# Patient Record
Sex: Female | Born: 2005 | Race: Black or African American | Hispanic: No | Marital: Single | Smoking: Never smoker
Health system: Southern US, Community
[De-identification: ages and names within clinical notes are randomized; demographics above are authoritative.]

## PROBLEM LIST (undated history)

## (undated) DIAGNOSIS — Z789 Other specified health status: Secondary | ICD-10-CM

## (undated) HISTORY — DX: Other specified health status: Z78.9

## (undated) HISTORY — PX: NO PAST SURGERIES: SHX2092

---
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
## 2024-02-24 ENCOUNTER — Other Ambulatory Visit: Payer: Self-pay

## 2024-02-24 ENCOUNTER — Emergency Department
Admission: EM | Admit: 2024-02-24 | Discharge: 2024-02-24 | Disposition: A | Discharge status: Home / Self Care | Attending: Emergency Medicine | Admitting: Emergency Medicine

## 2024-02-24 ENCOUNTER — Emergency Department

## 2024-02-24 DIAGNOSIS — O219 Vomiting of pregnancy, unspecified: Secondary | ICD-10-CM | POA: Diagnosis present

## 2024-02-24 DIAGNOSIS — Z3A01 Less than 8 weeks gestation of pregnancy: Secondary | ICD-10-CM | POA: Insufficient documentation

## 2024-02-24 DIAGNOSIS — R109 Unspecified abdominal pain: Secondary | ICD-10-CM | POA: Diagnosis not present

## 2024-02-24 DIAGNOSIS — O26891 Other specified pregnancy related conditions, first trimester: Secondary | ICD-10-CM | POA: Insufficient documentation

## 2024-02-24 LAB — COMPREHENSIVE METABOLIC PANEL WITH GFR
ALT: 20 U/L (ref 0–44)
AST: 29 U/L (ref 15–41)
Albumin: 4.7 g/dL (ref 3.5–5.0)
Alkaline Phosphatase: 52 U/L (ref 38–126)
Anion gap: 16 — ABNORMAL HIGH (ref 5–15)
BUN: 13 mg/dL (ref 6–20)
CO2: 18 mmol/L — ABNORMAL LOW (ref 22–32)
Calcium: 9.8 mg/dL (ref 8.9–10.3)
Chloride: 100 mmol/L (ref 98–111)
Creatinine, Ser: 0.7 mg/dL (ref 0.44–1.00)
GFR, Estimated: >60 mL/min (ref >60)
Glucose, Bld: 101 mg/dL — ABNORMAL HIGH (ref 70–99)
Potassium: 3.1 mmol/L — ABNORMAL LOW (ref 3.5–5.1)
Sodium: 134 mmol/L — ABNORMAL LOW (ref 135–145)
Total Bilirubin: 1.5 mg/dL — ABNORMAL HIGH (ref 0.0–1.2)
Total Protein: 8.2 g/dL — ABNORMAL HIGH (ref 6.5–8.1)

## 2024-02-24 LAB — URINALYSIS, ROUTINE W REFLEX MICROSCOPIC
Bilirubin Urine: NEGATIVE
Glucose, UA: NEGATIVE mg/dL
Hgb urine dipstick: NEGATIVE
Ketones, ur: 80 mg/dL — AB
Leukocytes,Ua: NEGATIVE
Nitrite: NEGATIVE
Protein, ur: 100 mg/dL — AB
Specific Gravity, Urine: 1.033 — ABNORMAL HIGH (ref 1.005–1.030)
pH: 5 (ref 5.0–8.0)

## 2024-02-24 LAB — CBC
HCT: 37.8 % (ref 36.0–46.0)
Hemoglobin: 12.5 g/dL (ref 12.0–15.0)
MCH: 28.5 pg (ref 26.0–34.0)
MCHC: 33.1 g/dL (ref 30.0–36.0)
MCV: 86.1 fL (ref 80.0–100.0)
Platelets: 357 K/uL (ref 150–400)
RBC: 4.39 MIL/uL (ref 3.87–5.11)
RDW: 13.4 % (ref 11.5–15.5)
WBC: 7.7 K/uL (ref 4.0–10.5)
nRBC: 0 % (ref 0.0–0.2)

## 2024-02-24 LAB — POC URINE PREG, ED: Preg Test, Ur: POSITIVE — AB

## 2024-02-24 LAB — LIPASE, BLOOD: Lipase: 36 U/L (ref 11–51)

## 2024-02-24 LAB — HCG, QUANTITATIVE, PREGNANCY: hCG, Beta Chain, Quant, S: 37910 m[IU]/mL — ABNORMAL HIGH (ref <5)

## 2024-02-24 MED ORDER — SODIUM CHLORIDE 0.9 % IV SOLN
12.5000 mg | Freq: Once | INTRAVENOUS | Status: AC
  Administered 2024-02-24: 12.5 mg via INTRAVENOUS
  Filled 2024-02-24: qty 12.5

## 2024-02-24 MED ORDER — DOXYLAMINE SUCCINATE (SLEEP) 25 MG PO TABS: 12.5000 mg | ORAL_TABLET | Freq: Three times a day (TID) | ORAL | 0 refills | Status: AC | PRN

## 2024-02-24 MED ORDER — POTASSIUM CHLORIDE 10 MEQ/100ML IV SOLN: 10.0000 meq | Freq: Once | INTRAVENOUS | Status: DC

## 2024-02-24 MED ORDER — VITAMIN B-6 50 MG PO TABS: 25.0000 mg | ORAL_TABLET | Freq: Three times a day (TID) | ORAL | 0 refills | Status: AC | PRN

## 2024-02-24 MED ORDER — DEXTROSE 5 % IN LACTATED RINGERS IV BOLUS
1000.0000 mL | Freq: Once | INTRAVENOUS | Status: AC
  Administered 2024-02-24: 1000 mL via INTRAVENOUS
  Filled 2024-02-24 (×2): qty 1000

## 2024-02-24 MED ORDER — ACETAMINOPHEN 325 MG PO TABS
650.0000 mg | ORAL_TABLET | Freq: Once | ORAL | Status: AC
  Administered 2024-02-24: 650 mg via ORAL
  Filled 2024-02-24: qty 2

## 2024-02-24 MED ORDER — PROMETHAZINE HCL 12.5 MG PO TABS: 12.5000 mg | ORAL_TABLET | Freq: Four times a day (QID) | ORAL | 0 refills | Status: AC | PRN

## 2024-02-24 MED ORDER — POTASSIUM CHLORIDE CRYS ER 20 MEQ PO TBCR
40.0000 meq | EXTENDED_RELEASE_TABLET | Freq: Once | ORAL | Status: AC
  Administered 2024-02-24: 40 meq via ORAL
  Filled 2024-02-24: qty 2

## 2024-02-24 NOTE — Discharge Instructions (Addendum)
 You were seen in the ER today for evaluation of your vomiting.  I suspect this is related to something called hyperemesis that can occur in pregnancy.  I sent a prescription for multiple nausea medicines to your pharmacy.  I would start by taking the pyridoxine and doxylamine .  However if you are still having symptoms with this you can take the promethazine  as needed.  I recommend frequent small meals.  Follow-up with OB/GYN for further evaluation.  Return to the ER if you develop new or worsening symptoms including worsening abdominal pain, vaginal bleeding, or inability to tolerate food or liquids despite taking the medication you were prescribed.

## 2024-02-24 NOTE — ED Provider Notes (Signed)
 [facility] Provider Note    Event Date/Time   First MD Initiated Contact with Patient 02/24/24 1048     (approximate)   History   Abdominal Pain   HPI  Dominique Salazar is a 18 year old female senting to the emergency department for evaluation of vomiting and abdominal cramping.  Patient reports that for the past month she has had some mild abdominal cramping with multiple episodes of nonbloody vomiting.  LMP 6/24, is sexually active.  No dysuria, urinary frequency.  Reports that her vomiting has been getting worse and she is no longer able to tolerate even liquids.  No vaginal bleeding.     Physical Exam   Triage Vital Signs: ED Triage Vitals  Encounter Vitals Group     BP 02/24/24 1038 (!) 139/90     Girls Systolic BP Percentile --      Girls Diastolic BP Percentile --      Boys Systolic BP Percentile --      Boys Diastolic BP Percentile --      Pulse Rate 02/24/24 1038 (!) 52     Resp 02/24/24 1038 18     Temp 02/24/24 1038 98 F (36.7 C)     Temp src --      SpO2 02/24/24 1038 99 %     Weight 02/24/24 1037 105 lb (47.6 kg)     Height 02/24/24 1037 5' 3 (1.6 m)     Head Circumference --      Peak Flow --      Pain Score 02/24/24 1037 8     Pain Loc --      Pain Education --      Exclude from Growth Chart --     Most recent vital signs: Vitals:   02/24/24 1038  BP: (!) 139/90  Pulse: (!) 52  Resp: 18  Temp: 98 F (36.7 C)  SpO2: 99%     General: Awake, interactive  CV:  Regular rate, good peripheral perfusion.  Resp:  Unlabored respirations.  Abd:  Nondistended, soft, no significant tenderness to palpation  Neuro:  Symmetric facial movement, fluid speech   ED Results / Procedures / Treatments   Labs (all labs ordered are listed, but only abnormal results are displayed) Labs Reviewed  COMPREHENSIVE METABOLIC PANEL WITH GFR - Abnormal; Notable for the following components:      Result Value   Sodium 134 (*)     Potassium 3.1 (*)    CO2 18 (*)    Glucose, Bld 101 (*)    Total Protein 8.2 (*)    Total Bilirubin 1.5 (*)    Anion gap 16 (*)    All other components within normal limits  URINALYSIS, ROUTINE W REFLEX MICROSCOPIC - Abnormal; Notable for the following components:   Color, Urine YELLOW (*)    APPearance HAZY (*)    Specific Gravity, Urine 1.033 (*)    Ketones, ur 80 (*)    Protein, ur 100 (*)    Bacteria, UA FEW (*)    All other components within normal limits  HCG, QUANTITATIVE, PREGNANCY - Abnormal; Notable for the following components:   hCG, Beta Chain, Quant, S 37,910 (*)    All other components within normal limits  POC URINE PREG, ED - Abnormal; Notable for the following components:   Preg Test, Ur Positive (*)    All other components within normal limits  LIPASE, BLOOD  CBC     EKG EKG independently reviewed  and interpreted by myself demonstrates:    RADIOLOGY Imaging independently reviewed and interpreted by myself demonstrates:  Ultrasound demonstrates IUP at estimated [redacted] weeks gestation  Formal Radiology Read:  US  OB LESS THAN 14 WEEKS WITH OB TRANSVAGINAL Result Date: 02/24/2024 CLINICAL DATA:  Abdominal pain in 1st trimester pregnancy. EXAM: OBSTETRIC <14 WK US  AND TRANSVAGINAL OB US  TECHNIQUE: Both transabdominal and transvaginal ultrasound examinations were performed for complete evaluation of the gestation as well as the maternal uterus, adnexal regions, and pelvic cul-de-sac. Transvaginal technique was performed to assess early pregnancy. COMPARISON:  None Available. FINDINGS: Intrauterine gestational sac: Single Yolk sac:  Visualized. Embryo:  Visualized. Cardiac Activity: Visualized. Heart Rate: 100 bpm CRL:  4 mm   6 w   1 d                  US  EDC: 10/18/2024 Subchorionic hemorrhage:  None visualized. Maternal uterus/adnexae: Small right ovarian corpus luteum noted. Normal appearance of left ovary. No mass or abnormal free fluid identified. IMPRESSION: Single  living IUP with estimated gestational age of [redacted] weeks 1 day. No maternal uterine or adnexal abnormality identified. Electronically Signed   By: Norleen DELENA Kil M.D.   On: 02/24/2024 12:16    PROCEDURES:  Critical Care performed: No  Procedures   MEDICATIONS ORDERED IN ED: Medications  potassium chloride  SA (KLOR-CON  M) CR tablet 40 mEq (has no administration in time range)  dextrose  5% lactated ringers  bolus 1,000 mL (0 mLs Intravenous Stopped 02/24/24 1347)  promethazine  (PHENERGAN ) 12.5 mg in sodium chloride  0.9 % 50 mL IVPB (0 mg Intravenous Stopped 02/24/24 1259)  acetaminophen  (TYLENOL ) tablet 650 mg (650 mg Oral Given 02/24/24 1402)     IMPRESSION / MDM / ASSESSMENT AND PLAN / ED COURSE  I reviewed the triage vital signs and the nursing notes.  Differential diagnosis includes, but is not limited to, hyperemesis gravidarum, morning sickness, viral illness, electrolyte abnormality  Patient's presentation is most consistent with acute presentation with potential threat to life or bodily function.  17 year old female presenting with vomiting.  UPT from triage positive.  Labs with reassuring CBC, CMP notable for mild acidosis, likely related to dehydration.  Reassuring creatinine at 0.70.  Beta hCG 37,000.  Normal lipase.  UA with presence of ketones consistent with diagnosis of hyperemesis.  Ultrasound demonstrates IUP without other complicating features.  IV fluids, Phenergan , Tylenol  here.  Reported feeling much improved on reevaluation.  Was able to tolerate p.o. intake.  I did consider admission, but given improvement in symptoms, do think discharge with trial of antiemetics is reasonable.  Patient does understand importance of following up with OB, returning to the ER if she is unable to tolerate oral intake with antiemetics.  Strict return precautions were provided.  Patient was discharged in stable condition.     FINAL CLINICAL IMPRESSION(S) / ED DIAGNOSES   Final diagnoses:   Excessive vomiting in pregnancy     Rx / DC Orders   ED Discharge Orders          Ordered    pyridOXINE (VITAMIN B6) 50 MG tablet  Every 8 hours PRN        02/24/24 1434    doxylamine , Sleep, (UNISOM ) 25 MG tablet  Every 8 hours PRN        02/24/24 1434    promethazine  (PHENERGAN ) 12.5 MG tablet  Every 6 hours PRN        02/24/24 1434  Note:  This document was prepared using Dragon voice recognition software and may include unintentional dictation errors.   Levander Slate, MD 02/24/24 1435

## 2024-02-24 NOTE — ED Triage Notes (Signed)
 Pt comes with belly pain for about month now. Pt states cramping and vomiting. Pt stats possibly of pregnancy. Pt states no urinary symptoms.

## 2024-03-02 ENCOUNTER — Ambulatory Visit (LOCAL_COMMUNITY_HEALTH_CENTER)

## 2024-03-02 VITALS — BP 123/70 | Ht 63.0 in | Wt 96.5 lb

## 2024-03-02 DIAGNOSIS — Z3201 Encounter for pregnancy test, result positive: Secondary | ICD-10-CM

## 2024-03-02 DIAGNOSIS — Z309 Encounter for contraceptive management, unspecified: Secondary | ICD-10-CM

## 2024-03-02 LAB — PREGNANCY, URINE: Preg Test, Ur: POSITIVE — AB

## 2024-03-02 MED ORDER — PRENATAL 27-0.8 MG PO TABS
1.0000 | ORAL_TABLET | Freq: Every day | ORAL | Status: AC
Start: 2024-03-02 — End: 2024-06-10

## 2024-03-02 NOTE — Progress Notes (Signed)
 UPT positive. Plans prenatal care at [facility]. Advised to establish prenatal care.  Positive preg packet given and reviewed.   The patient was dispensed prenatal vitamins #100 today per [facility] Dr JAYSON Helling. I provided counseling today regarding the medication. We discussed the medication, the side effects and when to call clinic. Patient given the opportunity to ask questions. Questions answered.    Sent to clerk for presumptive eligibility medicaid/preg women. Daviel Allegretto, RN

## 2024-05-01 ENCOUNTER — Emergency Department

## 2024-05-01 ENCOUNTER — Other Ambulatory Visit: Payer: Self-pay

## 2024-05-01 ENCOUNTER — Emergency Department
Admission: EM | Admit: 2024-05-01 | Discharge: 2024-05-01 | Disposition: A | Discharge status: Home / Self Care | Attending: Emergency Medicine | Admitting: Emergency Medicine

## 2024-05-01 DIAGNOSIS — O209 Hemorrhage in early pregnancy, unspecified: Secondary | ICD-10-CM | POA: Insufficient documentation

## 2024-05-01 DIAGNOSIS — O4690 Antepartum hemorrhage, unspecified, unspecified trimester: Secondary | ICD-10-CM

## 2024-05-01 DIAGNOSIS — Z3A15 15 weeks gestation of pregnancy: Secondary | ICD-10-CM | POA: Diagnosis not present

## 2024-05-01 LAB — CBC
HCT: 33.2 % — ABNORMAL LOW (ref 36.0–46.0)
Hemoglobin: 10.9 g/dL — ABNORMAL LOW (ref 12.0–15.0)
MCH: 28.6 pg (ref 26.0–34.0)
MCHC: 32.8 g/dL (ref 30.0–36.0)
MCV: 87.1 fL (ref 80.0–100.0)
Platelets: 297 K/uL (ref 150–400)
RBC: 3.81 MIL/uL — ABNORMAL LOW (ref 3.87–5.11)
RDW: 13.9 % (ref 11.5–15.5)
WBC: 8.8 K/uL (ref 4.0–10.5)
nRBC: 0 % (ref 0.0–0.2)

## 2024-05-01 LAB — BASIC METABOLIC PANEL WITH GFR
Anion gap: 11 (ref 5–15)
BUN: 7 mg/dL (ref 6–20)
CO2: 22 mmol/L (ref 22–32)
Calcium: 8.7 mg/dL — ABNORMAL LOW (ref 8.9–10.3)
Chloride: 105 mmol/L (ref 98–111)
Creatinine, Ser: 0.5 mg/dL (ref 0.44–1.00)
GFR, Estimated: >60 mL/min (ref >60)
Glucose, Bld: 85 mg/dL (ref 70–99)
Potassium: 3.5 mmol/L (ref 3.5–5.1)
Sodium: 138 mmol/L (ref 135–145)

## 2024-05-01 LAB — ABO/RH: ABO/RH(D): A POS

## 2024-05-01 NOTE — ED Triage Notes (Signed)
 Pt to ED for vaginal bleeding noticed this am. [redacted] weeks pregnant. Is currently on 1st pad

## 2024-05-01 NOTE — ED Provider Notes (Signed)
 [facility] Provider Note    Event Date/Time   First MD Initiated Contact with Patient 05/01/24 [phone]     (approximate)   History   Vaginal Bleeding   HPI  Dominique Salazar is a 18 y.o. female who reports she is approximately [redacted] weeks pregnant who presents with complaints of vaginal bleeding.  Patient reports she felt something wet this morning, wiped with a tissue and there was some blood, she has not had any further bleeding since then.  She denies abdominal pain.     Physical Exam   Triage Vital Signs: ED Triage Vitals  Encounter Vitals Group     BP 05/01/24 0909 117/85     Girls Systolic BP Percentile --      Girls Diastolic BP Percentile --      Boys Systolic BP Percentile --      Boys Diastolic BP Percentile --      Pulse Rate 05/01/24 0909 77     Resp 05/01/24 0909 16     Temp 05/01/24 0909 98.5 F (36.9 C)     Temp src --      SpO2 05/01/24 0909 97 %     Weight 05/01/24 0907 45.8 kg (101 lb)     Height 05/01/24 0907 1.6 m (5' 3)     Head Circumference --      Peak Flow --      Pain Score 05/01/24 0907 3     Pain Loc --      Pain Education --      Exclude from Growth Chart --     Most recent vital signs: Vitals:   05/01/24 1115 05/01/24 1245  BP:  (!) 99/54  Pulse:  91  Resp:  18  Temp:  98.5 F (36.9 C)  SpO2: 98% 95%     General: Awake, no distress.  CV:  Good peripheral perfusion.  Resp:  Normal effort.  Abd:  No distention.  Soft, nontender, gravid. Other:     ED Results / Procedures / Treatments   Labs (all labs ordered are listed, but only abnormal results are displayed) Labs Reviewed  CBC - Abnormal; Notable for the following components:      Result Value   RBC 3.81 (*)    Hemoglobin 10.9 (*)    HCT 33.2 (*)    All other components within normal limits  BASIC METABOLIC PANEL WITH GFR - Abnormal; Notable for the following components:   Calcium 8.7 (*)    All other components within normal limits   ABO/RH     EKG     RADIOLOGY Ultrasound pending    PROCEDURES:  Critical Care performed:   Procedures   MEDICATIONS ORDERED IN ED: Medications - No data to display   IMPRESSION / MDM / ASSESSMENT AND PLAN / ED COURSE  I reviewed the triage vital signs and the nursing notes. Patient's presentation is most consistent with acute presentation with potential threat to life or bodily function.  Patient presents with vaginal bleeding in second trimester pregnancy, differential includes miscarriage, placental abnormality, however given minimal bleeding low suspicion for either of those, pending ultrasound, ABO Rh.  Patient is Rh+   Ultrasound is unremarkable, patient is reassured, will have her follow-up closely with her OB/GYN     FINAL CLINICAL IMPRESSION(S) / ED DIAGNOSES   Final diagnoses:  Vaginal bleeding in pregnancy     Rx / DC Orders   ED Discharge Orders  None        Note:  This document was prepared using Dragon voice recognition software and may include unintentional dictation errors.   Arlander Charleston, MD 05/01/24 [phone]

## 2024-05-24 NOTE — Progress Notes (Signed)
 Patient's last menstrual period was 01/14/2024. Estimated Date of Delivery: 10/20/24  18 y.o. G1P0 at [redacted]w[redacted]d  The primary encounter diagnosis was Encounter for supervision of normal first pregnancy in second trimester ([facility]). A diagnosis of Underweight (BMI < 18.5) was also pertinent to this visit.  S:   Patient concerns today:  - none  Chief Complaint  Patient presents with  . Routine Prenatal Visit    Reports: no problems  Denies bleeding, contractions, cramping or leaking.   O:   See [facility] flowsheets. BP (!) 123/82   Pulse 86   Ht 160 cm (5' 3)   Wt 49.9 kg (110 lb)   LMP 01/14/2024   BMI 19.49 kg/m  Gen: NAD  Pulm: No use of accessory muscles, normal respirations Abdomen: Gravid, nontender Pelvic: SVE deferred Ext : no edema, no rashes Psych: Mood, insight, judgement intact  [redacted]w[redacted]d Pregnancy Scan Date: 06/01/2024 Anatomy: normal Presentation: variable EFW: 341g = 66% FHR: 146bpm Previa: none Placenta: posterior AFI: normal Adnexa: normal CL: 4.6cm   A/P:  18 y.o. G1P0 at [redacted]w[redacted]d   Underweight BMI 17 at NOB 4.99 kg (11 lb)   - Problem list reviewed and/or updated. - Reviewed anatomy scan results - Return in about 4 weeks (around 06/29/2024) for Routine OB visit.   Attestation Statement:   I personally performed the service, non-incident to. [facility])   JENNIFER RICHARDSON MYRON DELON MYRON, CNM 06/01/2024 2:50 PM
